# Patient Record
Sex: Male | Born: 1985 | Race: Black or African American | Hispanic: No | Marital: Single | Smoking: Current every day smoker
Health system: Southern US, Community
[De-identification: ages and names within clinical notes are randomized; demographics above are authoritative.]

---

## 2005-12-31 ENCOUNTER — Emergency Department (HOSPITAL_COMMUNITY): Admission: EM | Admit: 2005-12-31 | Discharge: 2006-01-01 | Payer: Self-pay | Admitting: Emergency Medicine

## 2007-04-27 ENCOUNTER — Emergency Department (HOSPITAL_COMMUNITY): Admission: EM | Admit: 2007-04-27 | Discharge: 2007-04-27 | Payer: Self-pay | Admitting: Emergency Medicine

## 2007-06-05 ENCOUNTER — Emergency Department (HOSPITAL_COMMUNITY): Admission: EM | Admit: 2007-06-05 | Discharge: 2007-06-05 | Payer: Self-pay | Admitting: Emergency Medicine

## 2007-08-02 ENCOUNTER — Emergency Department (HOSPITAL_COMMUNITY): Admission: EM | Admit: 2007-08-02 | Discharge: 2007-08-02 | Payer: Self-pay | Admitting: Emergency Medicine

## 2008-04-15 ENCOUNTER — Emergency Department (HOSPITAL_COMMUNITY): Admission: EM | Admit: 2008-04-15 | Discharge: 2008-04-15 | Payer: Self-pay | Admitting: Emergency Medicine

## 2008-06-03 ENCOUNTER — Emergency Department (HOSPITAL_COMMUNITY): Admission: EM | Admit: 2008-06-03 | Discharge: 2008-06-03 | Payer: Self-pay | Admitting: Emergency Medicine

## 2009-02-15 ENCOUNTER — Observation Stay (HOSPITAL_COMMUNITY): Admission: EM | Admit: 2009-02-15 | Discharge: 2009-02-18 | Payer: Self-pay | Admitting: Emergency Medicine

## 2009-02-15 ENCOUNTER — Ambulatory Visit: Payer: Self-pay | Admitting: Internal Medicine

## 2009-02-17 ENCOUNTER — Encounter: Payer: Self-pay | Admitting: Internal Medicine

## 2009-12-22 ENCOUNTER — Emergency Department (HOSPITAL_COMMUNITY): Admission: EM | Admit: 2009-12-22 | Discharge: 2009-12-22 | Payer: Self-pay | Admitting: Emergency Medicine

## 2010-05-26 ENCOUNTER — Emergency Department (HOSPITAL_COMMUNITY)
Admission: EM | Admit: 2010-05-26 | Discharge: 2010-05-26 | Payer: Self-pay | Source: Home / Self Care | Admitting: Emergency Medicine

## 2010-08-26 LAB — RAPID URINE DRUG SCREEN, HOSP PERFORMED
Barbiturates: NOT DETECTED
Benzodiazepines: NOT DETECTED
Cocaine: NOT DETECTED

## 2010-08-26 LAB — DIFFERENTIAL
Basophils Absolute: 0 10*3/uL (ref 0.0–0.1)
Basophils Relative: 0 % (ref 0–1)
Eosinophils Absolute: 0.1 10*3/uL (ref 0.0–0.7)
Eosinophils Relative: 1 % (ref 0–5)
Lymphocytes Relative: 7 % — ABNORMAL LOW (ref 12–46)
Monocytes Absolute: 0.9 10*3/uL (ref 0.1–1.0)

## 2010-08-26 LAB — CBC
HCT: 35.2 % — ABNORMAL LOW (ref 39.0–52.0)
Hemoglobin: 12.1 g/dL — ABNORMAL LOW (ref 13.0–17.0)
MCHC: 34.2 g/dL (ref 30.0–36.0)
MCV: 92.4 fL (ref 78.0–100.0)
MCV: 92.6 fL (ref 78.0–100.0)
Platelets: 188 10*3/uL (ref 150–400)
RBC: 3.8 MIL/uL — ABNORMAL LOW (ref 4.22–5.81)
RBC: 4.34 MIL/uL (ref 4.22–5.81)
RDW: 13 % (ref 11.5–15.5)
WBC: 17.3 10*3/uL — ABNORMAL HIGH (ref 4.0–10.5)

## 2010-08-26 LAB — URINE CULTURE: Culture: NO GROWTH

## 2010-08-26 LAB — COMPREHENSIVE METABOLIC PANEL
ALT: 11 U/L (ref 0–53)
ALT: 19 U/L (ref 0–53)
AST: 20 U/L (ref 0–37)
Albumin: 3.9 g/dL (ref 3.5–5.2)
Alkaline Phosphatase: 64 U/L (ref 39–117)
BUN: 2 mg/dL — ABNORMAL LOW (ref 6–23)
CO2: 26 mEq/L (ref 19–32)
CO2: 32 mEq/L (ref 19–32)
Calcium: 9 mg/dL (ref 8.4–10.5)
Chloride: 101 mEq/L (ref 96–112)
Creatinine, Ser: 0.97 mg/dL (ref 0.4–1.5)
Creatinine, Ser: 0.98 mg/dL (ref 0.4–1.5)
GFR calc Af Amer: >60 mL/min (ref >60)
GFR calc non Af Amer: >60 mL/min (ref >60)
GFR calc non Af Amer: >60 mL/min (ref >60)
Glucose, Bld: 95 mg/dL (ref 70–99)
Potassium: 3.4 mEq/L — ABNORMAL LOW (ref 3.5–5.1)
Sodium: 136 mEq/L (ref 135–145)
Total Bilirubin: 0.7 mg/dL (ref 0.3–1.2)

## 2010-08-26 LAB — HIV ANTIBODY (ROUTINE TESTING W REFLEX): HIV: NONREACTIVE

## 2010-08-26 LAB — URINALYSIS, ROUTINE W REFLEX MICROSCOPIC
Bilirubin Urine: NEGATIVE
Hgb urine dipstick: NEGATIVE
Ketones, ur: NEGATIVE mg/dL
Nitrite: NEGATIVE
Specific Gravity, Urine: 1.02 (ref 1.005–1.030)
Urobilinogen, UA: 0.2 mg/dL (ref 0.0–1.0)

## 2010-08-26 LAB — PORPHOBILINOGEN, 24 HR URINE-QUANT
Creatinine, Urine-mg/dL-PORPQ: 183 mg/dL
Porphobilinogen Qual, Ur: <3 umol/d (ref 0.0–11.0)
Volume, Urine-PORPQ: 925

## 2010-08-26 LAB — LIPID PANEL
Cholesterol: 114 mg/dL (ref 0–200)
LDL Cholesterol: 74 mg/dL (ref 0–99)
Triglycerides: 47 mg/dL (ref <150)

## 2010-08-26 LAB — CULTURE, BLOOD (ROUTINE X 2)

## 2010-08-26 LAB — LIPASE, BLOOD: Lipase: 16 U/L (ref 11–59)

## 2010-08-26 LAB — TSH: TSH: 0.646 u[IU]/mL (ref 0.350–4.500)

## 2010-09-05 LAB — ETHANOL: Alcohol, Ethyl (B): <5 mg/dL (ref 0–10)

## 2010-09-05 LAB — POCT I-STAT, CHEM 8
Glucose, Bld: 82 mg/dL (ref 70–99)
HCT: 43 % (ref 39.0–52.0)
Hemoglobin: 14.6 g/dL (ref 13.0–17.0)
Potassium: 3.6 mEq/L (ref 3.5–5.1)
Sodium: 140 mEq/L (ref 135–145)

## 2011-11-18 ENCOUNTER — Emergency Department (HOSPITAL_BASED_OUTPATIENT_CLINIC_OR_DEPARTMENT_OTHER)
Admission: EM | Admit: 2011-11-18 | Discharge: 2011-11-18 | Disposition: A | Discharge status: Home / Self Care | Payer: Self-pay
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
## 2011-11-18 NOTE — ED Notes (Signed)
Pt was questioned by registration staff for alternatives DOB.  Instead on answering, pt said he needed something in his car and left facility.  Pt was not been back to be triaged in over 20 minutes

## 2013-05-12 ENCOUNTER — Emergency Department (HOSPITAL_COMMUNITY)
Admission: EM | Admit: 2013-05-12 | Discharge: 2013-05-12 | Disposition: A | Discharge status: Home / Self Care | Payer: Self-pay | Attending: Emergency Medicine | Admitting: Emergency Medicine

## 2013-05-12 ENCOUNTER — Encounter (HOSPITAL_COMMUNITY): Payer: Self-pay | Admitting: Emergency Medicine

## 2013-05-12 DIAGNOSIS — K047 Periapical abscess without sinus: Secondary | ICD-10-CM | POA: Insufficient documentation

## 2013-05-12 DIAGNOSIS — Z791 Long term (current) use of non-steroidal anti-inflammatories (NSAID): Secondary | ICD-10-CM | POA: Insufficient documentation

## 2013-05-12 MED ORDER — TRAMADOL HCL 50 MG PO TABS
50.0000 mg | ORAL_TABLET | Freq: Once | ORAL | Status: AC
  Administered 2013-05-12: 50 mg via ORAL
  Filled 2013-05-12: qty 1

## 2013-05-12 MED ORDER — TRAMADOL HCL 50 MG PO TABS: 50.0000 mg | ORAL_TABLET | Freq: Four times a day (QID) | ORAL | Status: DC

## 2013-05-12 MED ORDER — PENICILLIN V POTASSIUM 500 MG PO TABS
500.0000 mg | ORAL_TABLET | Freq: Once | ORAL | Status: AC
  Administered 2013-05-12: 500 mg via ORAL
  Filled 2013-05-12: qty 1

## 2013-05-12 MED ORDER — PENICILLIN V POTASSIUM 500 MG PO TABS: 500.0000 mg | ORAL_TABLET | Freq: Four times a day (QID) | ORAL | Status: DC

## 2013-05-12 NOTE — ED Provider Notes (Signed)
CSN: [id number]     Arrival date & time 05/12/13  1050 History   First MD Initiated Contact with Patient 05/12/13 1104     Chief Complaint  Patient presents with  . Oral Swelling  . Dental Pain   (Consider location/radiation/quality/duration/timing/severity/associated sxs/prior Treatment) HPI Comments: Patient was very poor oral hygiene, presents with right upper, facial swelling, and dental pain for the past several, days.  She's been taking ibuprofen, without any relief.  He has no dentist stating he cannot afford dental care  Patient is a 27 y.o. male presenting with tooth pain. The history is provided by the patient.  Dental Pain Location:  Upper Upper teeth location:  4/RU 2nd bicuspid and 5/RU 1st bicuspid Quality:  Pulsating Severity:  Severe Onset quality:  Gradual Timing:  Constant Progression:  Worsening Context: abscess and dental caries   Relieved by:  Nothing Worsened by:  Touching Ineffective treatments:  Acetaminophen and NSAIDs Associated symptoms: facial pain, facial swelling and gum swelling   Associated symptoms: no fever and no headaches     History reviewed. No pertinent past medical history. History reviewed. No pertinent past surgical history. No family history on file. History  Substance Use Topics  . Smoking status: Not on file  . Smokeless tobacco: Not on file  . Alcohol Use: Not on file    Review of Systems  Constitutional: Negative for fever.  HENT: Positive for dental problem and facial swelling.   Neurological: Negative for dizziness and headaches.  All other systems reviewed and are negative.    Allergies  Review of patient's allergies indicates no known allergies.  Home Medications   Current Outpatient Rx  Name  Route  Sig  Dispense  Refill  . HYDROcodone-acetaminophen (NORCO/VICODIN) 5-325 MG per tablet   Oral   Take 1 tablet by mouth every 6 (six) hours as needed for moderate pain.         Marland Kitchen ibuprofen (ADVIL,MOTRIN) 800 MG  tablet   Oral   Take 1,600 mg by mouth every 8 (eight) hours as needed (pain).         . meloxicam (MOBIC) 7.5 MG tablet   Oral   Take 7.5 mg by mouth daily.         . methocarbamol (ROBAXIN) 500 MG tablet   Oral   Take 1,000 mg by mouth every 6 (six) hours as needed for muscle spasms (pain).         Marland Kitchen penicillin v potassium (VEETID) 500 MG tablet   Oral   Take 1 tablet (500 mg total) by mouth 4 (four) times daily.   39 tablet   0   . traMADol (ULTRAM) 50 MG tablet   Oral   Take 1 tablet (50 mg total) by mouth 4 (four) times daily.   30 tablet   0    BP 137/83  Pulse 88  Temp(Src) 97.9 F (36.6 C) (Oral)  Resp 20  SpO2 99% Physical Exam  Constitutional: He is oriented to person, place, and time. He appears well-developed and well-nourished. No distress.  HENT:  Head: Normocephalic.  Mouth/Throat:    Neck: Normal range of motion.  Cardiovascular: Normal rate.   Pulmonary/Chest: Effort normal.  Musculoskeletal: Normal range of motion.  Lymphadenopathy:    He has no cervical adenopathy.  Neurological: He is alert and oriented to person, place, and time.  Skin: Skin is warm. No erythema.    ED Course  Procedures (including critical care time) Labs Review Labs Reviewed -  No data to display Imaging Review No results found.  EKG Interpretation   None       MDM   1. Dental abscess     Patient has been prescribed, penicillin and Ultram, and referred to Dr. Mayford Knife for dental care    Arman Filter, NP 05/12/13 1113

## 2013-05-12 NOTE — ED Provider Notes (Signed)
Medical screening examination/treatment/procedure(s) were performed by non-physician practitioner and as supervising physician I was immediately available for consultation/collaboration.  EKG Interpretation   None        Orlen Leedy F Karita Dralle, MD 05/12/13 1933 

## 2013-05-12 NOTE — ED Notes (Signed)
Pt states that he has a hx of broken teeth. 2 days ago began having rt sided tooth pain and facial swelling. Pt c/o pain to the area with no relief. Has not been able to take anything for it.

## 2013-05-18 ENCOUNTER — Emergency Department (HOSPITAL_COMMUNITY)
Admission: EM | Admit: 2013-05-18 | Discharge: 2013-05-18 | Disposition: A | Discharge status: Home / Self Care | Payer: Self-pay | Attending: Emergency Medicine | Admitting: Emergency Medicine

## 2013-05-18 ENCOUNTER — Encounter (HOSPITAL_COMMUNITY): Payer: Self-pay | Admitting: Emergency Medicine

## 2013-05-18 DIAGNOSIS — K0889 Other specified disorders of teeth and supporting structures: Secondary | ICD-10-CM

## 2013-05-18 DIAGNOSIS — K089 Disorder of teeth and supporting structures, unspecified: Secondary | ICD-10-CM | POA: Insufficient documentation

## 2013-05-18 DIAGNOSIS — F17200 Nicotine dependence, unspecified, uncomplicated: Secondary | ICD-10-CM | POA: Insufficient documentation

## 2013-05-18 DIAGNOSIS — K029 Dental caries, unspecified: Secondary | ICD-10-CM | POA: Insufficient documentation

## 2013-05-18 DIAGNOSIS — Z79899 Other long term (current) drug therapy: Secondary | ICD-10-CM | POA: Insufficient documentation

## 2013-05-18 MED ORDER — NAPROXEN 500 MG PO TABS: 500.0000 mg | ORAL_TABLET | Freq: Two times a day (BID) | ORAL | Status: DC

## 2013-05-18 MED ORDER — HYDROCODONE-ACETAMINOPHEN 5-325 MG PO TABS
1.0000 | ORAL_TABLET | Freq: Once | ORAL | Status: AC
  Administered 2013-05-18: 1 via ORAL
  Filled 2013-05-18: qty 1

## 2013-05-18 MED ORDER — HYDROCODONE-ACETAMINOPHEN 5-325 MG PO TABS: ORAL_TABLET | ORAL | Status: DC

## 2013-05-18 MED ORDER — HYDROCODONE-ACETAMINOPHEN 5-325 MG PO TABS: ORAL_TABLET | ORAL | Status: AC

## 2013-05-18 NOTE — ED Provider Notes (Signed)
CSN: [id number]     Arrival date & time 05/18/13  1537 History   First MD Initiated Contact with Patient 05/18/13 1644    This chart was scribed for Nelle Don, a non-physician practitioner working with Hurman Horn, MD by Lewanda Rife, ED Scribe. This patient was seen in room WTR6/WTR6 and the patient's care was started at 5:35 PM     Chief Complaint  Patient presents with  . Dental Pain   (Consider location/radiation/quality/duration/timing/severity/associated sxs/prior Treatment) The history is provided by the patient. No language interpreter was used.   HPI Comments: Maurice Price is a 27 y.o. male who presents to the Emergency Department complaining of constant improving RUQ dental pain onset 1.5 weeks. Describes pain as moderate in severity. Reports he was evaluated for the same 6 days and prescribed tramadol and penicillin with mild relief of symptoms. Denies associated fever, chills, dysphagia, sore throat, and shortness of breath.  History reviewed. No pertinent past medical history. History reviewed. No pertinent past surgical history. History reviewed. No pertinent family history. History  Substance Use Topics  . Smoking status: Current Every Day Smoker -- 1.00 packs/day    Types: Cigarettes  . Smokeless tobacco: Not on file  . Alcohol Use: No    Review of Systems  Constitutional: Negative for fever.  HENT: Positive for dental problem. Negative for ear pain, facial swelling, sore throat and trouble swallowing.   Respiratory: Negative for shortness of breath and stridor.   Musculoskeletal: Negative for neck pain.  Skin: Negative for color change.  Neurological: Negative for headaches.    Allergies  Review of patient's allergies indicates no known allergies.  Home Medications   Current Outpatient Rx  Name  Route  Sig  Dispense  Refill  . HYDROcodone-acetaminophen (NORCO/VICODIN) 5-325 MG per tablet   Oral   Take 1 tablet by mouth every 6 (six) hours as  needed for moderate pain.         Marland Kitchen ibuprofen (ADVIL,MOTRIN) 800 MG tablet   Oral   Take 1,600 mg by mouth every 8 (eight) hours as needed (pain).         . meloxicam (MOBIC) 7.5 MG tablet   Oral   Take 7.5 mg by mouth daily.         . penicillin v potassium (VEETID) 500 MG tablet   Oral   Take 500 mg by mouth 4 (four) times daily.         . traMADol (ULTRAM) 50 MG tablet   Oral   Take 50 mg by mouth 2 (two) times daily.          There were no vitals taken for this visit. Physical Exam  Nursing note and vitals reviewed. Constitutional: He is oriented to person, place, and time. He appears well-developed and well-nourished. No distress.  HENT:  Head: Normocephalic and atraumatic.  Right Ear: Tympanic membrane, external ear and ear canal normal.  Left Ear: Tympanic membrane, external ear and ear canal normal.  Nose: Nose normal.  Mouth/Throat: Uvula is midline, oropharynx is clear and moist and mucous membranes are normal. No trismus in the jaw. Abnormal dentition. Dental caries present. No dental abscesses or uvula swelling. No tonsillar abscesses.  Generalized right upper dental pain.  No swelling, erythema, or obvious abscess noted on exam. Advanced periodontal disease with multiple caries.   Eyes: EOM are normal. Pupils are equal, round, and reactive to light.  Neck: Normal range of motion. Neck supple. No tracheal deviation present.  No neck swelling or Lugwig's angina  Cardiovascular: Normal rate.   Pulmonary/Chest: Effort normal. No respiratory distress.  Musculoskeletal: Normal range of motion.  Neurological: He is alert and oriented to person, place, and time.  Skin: Skin is warm and dry.  Psychiatric: He has a normal mood and affect. His behavior is normal.    ED Course  Procedures (including critical care time)  COORDINATION OF CARE:  Nursing notes reviewed. Vital signs reviewed. Initial pt interview and examination performed.   5:39 PM-Discussed  treatment plan with pt at bedside. Pt agrees with plan.   Treatment plan initiated:Medications - No data to display   Initial diagnostic testing ordered.    Labs Review Labs Reviewed - No data to display Imaging Review No results found.  EKG Interpretation   None      6:04 PM Patient seen and examined. Medications ordered.   Vital signs reviewed and are as follows: Filed Vitals:   05/18/13 1749  BP: 123/79  Pulse: 63  Temp: 98.3 F (36.8 C)  Resp: 16    Patient counseled to take prescribed medications as directed, return with worsening facial or neck swelling, and to follow-up with their dentist as soon as possible.   Patient counseled on use of narcotic pain medications. Counseled not to combine these medications with others containing tylenol. Urged not to drink alcohol, drive, or perform any other activities that requires focus while taking these medications. The patient verbalizes understanding and agrees with the plan.   MDM   1. Pain, dental    Patient with toothache.  No gross abscess.  Exam unconcerning for Ludwig's angina or other deep tissue infection in neck.  Will treat with pain medicine.  No infection signs indicating antibiotics. Urged patient to follow-up with dentist.     I personally performed the services described in this documentation, which was scribed in my presence. The recorded information has been reviewed and is accurate.    Maurice Crigler, PA-C 05/18/13 [phone]

## 2013-05-18 NOTE — ED Notes (Signed)
Pt c/o dental pain x 1.5 wks.  States the tramadol that he was given on Monday makes it worse.

## 2013-05-19 NOTE — ED Provider Notes (Signed)
Medical screening examination/treatment/procedure(s) were performed by non-physician practitioner and as supervising physician I was immediately available for consultation/collaboration.  Hurman Horn, MD 05/19/13 [phone]

## 2015-12-21 ENCOUNTER — Encounter (HOSPITAL_COMMUNITY): Payer: Self-pay | Admitting: *Deleted

## 2015-12-21 ENCOUNTER — Emergency Department (HOSPITAL_COMMUNITY): Payer: Self-pay

## 2015-12-21 ENCOUNTER — Emergency Department (HOSPITAL_COMMUNITY)
Admission: EM | Admit: 2015-12-21 | Discharge: 2015-12-21 | Disposition: A | Discharge status: Home / Self Care | Payer: Self-pay | Attending: Emergency Medicine | Admitting: Emergency Medicine

## 2015-12-21 DIAGNOSIS — F17210 Nicotine dependence, cigarettes, uncomplicated: Secondary | ICD-10-CM | POA: Insufficient documentation

## 2015-12-21 DIAGNOSIS — M25511 Pain in right shoulder: Secondary | ICD-10-CM | POA: Insufficient documentation

## 2015-12-21 DIAGNOSIS — M542 Cervicalgia: Secondary | ICD-10-CM | POA: Insufficient documentation

## 2015-12-21 MED ORDER — NAPROXEN 500 MG PO TABS: 500.0000 mg | ORAL_TABLET | Freq: Two times a day (BID) | ORAL | 0 refills | Status: AC

## 2015-12-21 MED ORDER — CYCLOBENZAPRINE HCL 10 MG PO TABS: 10.0000 mg | ORAL_TABLET | Freq: Two times a day (BID) | ORAL | 0 refills | Status: AC | PRN
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
## 2018-09-13 ENCOUNTER — Other Ambulatory Visit: Payer: Self-pay

## 2018-09-13 ENCOUNTER — Encounter (HOSPITAL_COMMUNITY): Payer: Self-pay | Admitting: *Deleted

## 2018-09-13 ENCOUNTER — Emergency Department (HOSPITAL_COMMUNITY)
Admission: EM | Admit: 2018-09-13 | Discharge: 2018-09-14 | Disposition: A | Discharge status: Home / Self Care | Payer: Medicaid Other | Attending: Emergency Medicine | Admitting: Emergency Medicine

## 2018-09-13 DIAGNOSIS — K0889 Other specified disorders of teeth and supporting structures: Secondary | ICD-10-CM | POA: Diagnosis present

## 2018-09-13 DIAGNOSIS — K029 Dental caries, unspecified: Secondary | ICD-10-CM | POA: Insufficient documentation

## 2018-09-13 DIAGNOSIS — R590 Localized enlarged lymph nodes: Secondary | ICD-10-CM | POA: Diagnosis not present

## 2018-09-13 DIAGNOSIS — K047 Periapical abscess without sinus: Secondary | ICD-10-CM | POA: Diagnosis not present

## 2018-09-13 DIAGNOSIS — F17210 Nicotine dependence, cigarettes, uncomplicated: Secondary | ICD-10-CM | POA: Diagnosis not present

## 2018-09-13 NOTE — ED Triage Notes (Signed)
The pt is c/o a toothache for 2-3 days

## 2018-09-14 MED ORDER — CLINDAMYCIN HCL 150 MG PO CAPS
450.0000 mg | ORAL_CAPSULE | Freq: Once | ORAL | Status: AC
  Administered 2018-09-14: 450 mg via ORAL
  Filled 2018-09-14: qty 3

## 2018-09-14 MED ORDER — CLINDAMYCIN HCL 150 MG PO CAPS: 450.0000 mg | ORAL_CAPSULE | Freq: Three times a day (TID) | ORAL | 0 refills | Status: AC

## 2018-09-14 MED ORDER — KETOROLAC TROMETHAMINE 60 MG/2ML IM SOLN
60.0000 mg | Freq: Once | INTRAMUSCULAR | Status: AC
  Administered 2018-09-14: 01:00:00 60 mg via INTRAMUSCULAR
  Filled 2018-09-14: qty 2

## 2018-09-14 NOTE — ED Provider Notes (Addendum)
[facility]   CSN: [id number] Arrival date & time: 09/13/18  2301    History   Chief Complaint Chief Complaint  Patient presents with  . Dental Pain    HPI Maurice Price is a 33 y.o. male with a hx of no major medical problems presents to the Emergency Department complaining of gradual, persistent, progressively worsening right lower dental pain onset 3 days ago.  Pt reports associated swelling at the site.  He denies fever, chills, voice change, difficulty swallowing, throat swelling, neck pain, neck stiffness, nausea, vomiting.  Patient reports taking ibuprofen without relief.  Patient reports he had an appointment with the dentist for tooth extraction but this was canceled and the dentist is closed to due to coronavirus.  Patient reports his pain is severe in nature.  Eating and drinking make it worse.  Nothing seems to make it better.      The history is provided by the patient and medical records. No language interpreter was used.    History reviewed. No pertinent past medical history.  There are no active problems to display for this patient.   History reviewed. No pertinent surgical history.      Home Medications    Prior to Admission medications   Medication Sig Start Date End Date Taking? Authorizing Provider  clindamycin (CLEOCIN) 150 MG capsule Take 3 capsules (450 mg total) by mouth 3 (three) times daily. 09/14/18   Kahli Mayon, Dahlia ClientHannah, PA-C  cyclobenzaprine (FLEXERIL) 10 MG tablet Take 1 tablet (10 mg total) by mouth 2 (two) times daily as needed for muscle spasms. 12/21/15   Cheri Fowlerose, Kayla, PA-C  HYDROcodone-acetaminophen (NORCO/VICODIN) 5-325 MG per tablet Take 1-2 tablets every 6 hours as needed for severe pain Patient not taking: Reported on 12/21/2015 05/18/13   Renne CriglerGeiple, Joshua, PA-C  naproxen (NAPROSYN) 500 MG tablet Take 1 tablet (500 mg total) by mouth 2 (two) times daily. 12/21/15   Cheri Fowlerose, Kayla, PA-C    Family  History No family history on file.  Social History Social History   Tobacco Use  . Smoking status: Current Every Day Smoker    Packs/day: 1.00    Types: Cigarettes  . Smokeless tobacco: Never Used  Substance Use Topics  . Alcohol use: No  . Drug use: No     Allergies   Patient has no known allergies.   Review of Systems Review of Systems  Constitutional: Negative for chills and fever.  HENT: Positive for dental problem. Negative for ear pain, sore throat and trouble swallowing.   Gastrointestinal: Negative for nausea and vomiting.  Musculoskeletal: Negative for neck pain and neck stiffness.  Neurological: Negative for headaches.     Physical Exam Updated Vital Signs BP (!) 158/107   Pulse 77   Temp 97.9 F (36.6 C)   Resp 16   Ht 6\' 4"  (1.93 m)   Wt 72.6 kg   SpO2 100%   BMI 19.48 kg/m   Physical Exam Vitals signs and nursing note reviewed.  Constitutional:      Appearance: He is well-developed.  HENT:     Head: Normocephalic.     Right Ear: External ear normal.     Left Ear: External ear normal.     Nose: Nose normal.     Right Sinus: No maxillary sinus tenderness or frontal sinus tenderness.     Left Sinus: No maxillary sinus tenderness or frontal sinus tenderness.     Mouth/Throat:     Mouth: No  lacerations or oral lesions.     Dentition: Abnormal dentition. Dental caries present.     Pharynx: Uvula midline. No oropharyngeal exudate, posterior oropharyngeal erythema or uvula swelling.     Tonsils: No tonsillar abscesses.     Comments: Poor dentition throughout.  Right lower molars with severe dental decay down to the gingiva.  Surrounding gingiva is erythematous, edematous with mild induration.  Small area of fluctuance between the gingiva and buccal mucosa. Eyes:     General:        Right eye: No discharge.        Left eye: No discharge.     Conjunctiva/sclera: Conjunctivae normal.  Neck:     Musculoskeletal: Normal range of motion and neck  supple.     Comments: No stridor Handling secretions without difficulty No nuchal rigidity Cardiovascular:     Rate and Rhythm: Normal rate and regular rhythm.  Pulmonary:     Effort: Pulmonary effort is normal. No respiratory distress.  Abdominal:     General: There is no distension.  Lymphadenopathy:     Cervical: Cervical adenopathy present.     Right cervical: Superficial cervical adenopathy present.     Left cervical: No superficial cervical adenopathy.  Skin:    General: Skin is warm and dry.  Neurological:     Mental Status: He is alert.      ED Treatments / Results   Procedures Procedures (including critical care time)  Medications Ordered in ED Medications  ketorolac (TORADOL) injection 60 mg (has no administration in time range)  clindamycin (CLEOCIN) capsule 450 mg (has no administration in time range)     Initial Impression / Assessment and Plan / ED Course  I have reviewed the triage vital signs and the nursing notes.  Pertinent labs & imaging results that were available during my care of the patient were reviewed by me and considered in my medical decision making (see chart for details).        Patient with toothache.  Small area of gross abscess.  Patient does not wish for I&D at this time.  Exam unconcerning for Ludwig's angina or spread of infection.  Will treat with clindamycin and anti-inflammatories medicine.  Urged patient to follow-up with dentist when they reopen..     Final Clinical Impressions(s) / ED Diagnoses   Final diagnoses:  Dental caries  Pain, dental  Dental abscess    ED Discharge Orders         Ordered    clindamycin (CLEOCIN) 150 MG capsule  3 times daily     09/14/18 0102           Ava Deguire, Boyd Kerbs 09/14/18 0103    Doniesha Landau, Dahlia Client, PA-C 09/14/18 0109    Zadie Rhine, MD 09/14/18 [phone]

## 2018-09-14 NOTE — Discharge Instructions (Addendum)
1. Medications: Clindamycin, usual home medications °2. Treatment: rest, drink plenty of fluids, take medications as prescribed °3. Follow Up: Please followup with dentistry within 1 week for discussion of your diagnoses and further evaluation after today's visit; if you do not have a primary care doctor use the resource guide provided to find one; Return to the ER for high fevers, difficulty breathing, difficulty swallowing or other concerning symptoms ° °

## 2020-09-17 ENCOUNTER — Other Ambulatory Visit: Payer: Self-pay

## 2020-09-17 ENCOUNTER — Emergency Department (HOSPITAL_COMMUNITY)
Admission: EM | Admit: 2020-09-17 | Discharge: 2020-09-17 | Disposition: A | Discharge status: Home / Self Care | Payer: Medicaid Other | Attending: Physician Assistant | Admitting: Physician Assistant

## 2020-09-17 DIAGNOSIS — K0889 Other specified disorders of teeth and supporting structures: Secondary | ICD-10-CM

## 2020-09-17 DIAGNOSIS — K029 Dental caries, unspecified: Secondary | ICD-10-CM | POA: Insufficient documentation

## 2020-09-17 DIAGNOSIS — F17210 Nicotine dependence, cigarettes, uncomplicated: Secondary | ICD-10-CM | POA: Diagnosis not present

## 2020-09-17 MED ORDER — IBUPROFEN 600 MG PO TABS: 600.0000 mg | ORAL_TABLET | Freq: Four times a day (QID) | ORAL | 0 refills | Status: AC | PRN

## 2020-09-17 MED ORDER — PENICILLIN V POTASSIUM 500 MG PO TABS: 500.0000 mg | ORAL_TABLET | Freq: Four times a day (QID) | ORAL | 0 refills | Status: AC

## 2020-09-17 NOTE — ED Provider Notes (Signed)
[facility] EMERGENCY DEPARTMENT Provider Note   CSN: [id number] Arrival date & time: 09/17/20  1801     History Chief Complaint  Patient presents with  . Dental Pain    Pt c/o dental pain x1 day.    Maurice Price is a 35 y.o. male.  HPI   35 y/o male presenting for eval of right lower dental pain that started last night. Denies fevers. Has some mild facial swelling. His dentist was closed today. Pain constant and severe in nature.   No past medical history on file.  There are no problems to display for this patient.   No past surgical history on file.     No family history on file.  Social History   Tobacco Use  . Smoking status: Current Every Day Smoker    Packs/day: 1.00    Types: Cigarettes  . Smokeless tobacco: Never Used  Substance Use Topics  . Alcohol use: No  . Drug use: No    Home Medications Prior to Admission medications   Medication Sig Start Date End Date Taking? Authorizing Provider  ibuprofen (ADVIL) 600 MG tablet Take 1 tablet (600 mg total) by mouth every 6 (six) hours as needed. 09/17/20  Yes Zyniah Ferraiolo S, PA-C  penicillin v potassium (VEETID) 500 MG tablet Take 1 tablet (500 mg total) by mouth 4 (four) times daily for 7 days. 09/17/20 09/24/20 Yes Trig Mcbryar S, PA-C  clindamycin (CLEOCIN) 150 MG capsule Take 3 capsules (450 mg total) by mouth 3 (three) times daily. 09/14/18   Muthersbaugh, Dahlia Client, PA-C  cyclobenzaprine (FLEXERIL) 10 MG tablet Take 1 tablet (10 mg total) by mouth 2 (two) times daily as needed for muscle spasms. 12/21/15   Cheri Fowler, PA-C  HYDROcodone-acetaminophen (NORCO/VICODIN) 5-325 MG per tablet Take 1-2 tablets every 6 hours as needed for severe pain Patient not taking: Reported on 12/21/2015 05/18/13   Renne Crigler, PA-C  naproxen (NAPROSYN) 500 MG tablet Take 1 tablet (500 mg total) by mouth 2 (two) times daily. 12/21/15   Cheri Fowler, PA-C    Allergies    Patient has no known allergies.  Review of  Systems   Review of Systems  Constitutional: Negative for fever.  HENT: Positive for dental problem.   Respiratory: Negative for shortness of breath.   Cardiovascular: Negative for chest pain.  Gastrointestinal: Negative for abdominal pain and diarrhea.  Musculoskeletal: Negative for back pain.  Neurological: Negative for headaches.    Physical Exam Updated Vital Signs BP (!) 134/97 (BP Location: Left Arm)   Pulse 67   Temp 98.8 F (37.1 C) (Oral)   Resp 16   SpO2 100%   Physical Exam Constitutional:      General: He is not in acute distress.    Appearance: He is well-developed.  HENT:     Mouth/Throat:     Comments: Diffuse dental caries and multiple missing teeth. Induration noted to the right lower jaw. No fluctuance noted.  Eyes:     Conjunctiva/sclera: Conjunctivae normal.  Cardiovascular:     Rate and Rhythm: Normal rate.  Pulmonary:     Effort: Pulmonary effort is normal.  Skin:    General: Skin is warm and dry.  Neurological:     Mental Status: He is alert and oriented to person, place, and time.     ED Results / Procedures / Treatments   Labs (all labs ordered are listed, but only abnormal results are displayed) Labs Reviewed - No data to display  EKG None  Radiology No results found.  Procedures Procedures   Medications Ordered in ED Medications - No data to display  ED Course  I have reviewed the triage vital signs and the nursing notes.  Pertinent labs & imaging results that were available during my care of the patient were reviewed by me and considered in my medical decision making (see chart for details).    MDM Rules/Calculators/A&P                          Patient with toothache.  No gross abscess.  Exam unconcerning for Ludwig's angina or spread of infection.  Will treat with penicillin and pain medicine.  Urged patient to follow-up with dentist.     Final Clinical Impression(s) / ED Diagnoses Final diagnoses:  Pain, dental     Rx / DC Orders ED Discharge Orders         Ordered    penicillin v potassium (VEETID) 500 MG tablet  4 times daily        09/17/20 1922    ibuprofen (ADVIL) 600 MG tablet  Every 6 hours PRN        09/17/20 [address] [place], Cordelia Poche 09/17/20 Selinda Eon, MD 09/17/20 [phone]

## 2020-09-17 NOTE — Discharge Instructions (Signed)
You were given a prescription for antibiotics. Please take the antibiotic prescription fully.   Please follow-up with a dentist in the next 5 to 7 days for reevaluation.  If you do not have a dentist, resources were provided for dentist in the area in your discharge summary.  Please contact one of the offices that are listed and make an appointment for follow-up.  Please return to the emergency department for any new or worsening symptoms.
